# Patient Record
Sex: Female | Born: 1995
Health system: Southern US, Community
[De-identification: ages and names within clinical notes are randomized; demographics above are authoritative.]

---
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
## 2017-01-30 DIAGNOSIS — H5213 Myopia, bilateral: Secondary | ICD-10-CM | POA: Diagnosis not present

## 2017-01-30 DIAGNOSIS — H52223 Regular astigmatism, bilateral: Secondary | ICD-10-CM | POA: Diagnosis not present

## 2018-02-22 DIAGNOSIS — G43119 Migraine with aura, intractable, without status migrainosus: Secondary | ICD-10-CM | POA: Diagnosis not present

## 2018-02-22 DIAGNOSIS — G43019 Migraine without aura, intractable, without status migrainosus: Secondary | ICD-10-CM | POA: Diagnosis not present

## 2018-05-04 DIAGNOSIS — H52223 Regular astigmatism, bilateral: Secondary | ICD-10-CM | POA: Diagnosis not present

## 2018-05-04 DIAGNOSIS — H5213 Myopia, bilateral: Secondary | ICD-10-CM | POA: Diagnosis not present

## 2018-09-14 DIAGNOSIS — B009 Herpesviral infection, unspecified: Secondary | ICD-10-CM | POA: Diagnosis not present

## 2018-09-14 DIAGNOSIS — B354 Tinea corporis: Secondary | ICD-10-CM | POA: Diagnosis not present

## 2018-09-14 DIAGNOSIS — Z7689 Persons encountering health services in other specified circumstances: Secondary | ICD-10-CM | POA: Diagnosis not present

## 2018-09-14 DIAGNOSIS — Z6822 Body mass index (BMI) 22.0-22.9, adult: Secondary | ICD-10-CM | POA: Diagnosis not present

## 2019-01-22 DIAGNOSIS — R0789 Other chest pain: Secondary | ICD-10-CM | POA: Diagnosis not present

## 2019-01-22 DIAGNOSIS — F419 Anxiety disorder, unspecified: Secondary | ICD-10-CM | POA: Diagnosis not present

## 2019-01-22 DIAGNOSIS — Z6822 Body mass index (BMI) 22.0-22.9, adult: Secondary | ICD-10-CM | POA: Diagnosis not present

## 2019-01-22 DIAGNOSIS — F329 Major depressive disorder, single episode, unspecified: Secondary | ICD-10-CM | POA: Diagnosis not present

## 2019-01-31 DIAGNOSIS — Z0000 Encounter for general adult medical examination without abnormal findings: Secondary | ICD-10-CM | POA: Diagnosis not present

## 2019-01-31 DIAGNOSIS — Z6822 Body mass index (BMI) 22.0-22.9, adult: Secondary | ICD-10-CM | POA: Diagnosis not present

## 2019-01-31 DIAGNOSIS — F419 Anxiety disorder, unspecified: Secondary | ICD-10-CM | POA: Diagnosis not present

## 2019-01-31 DIAGNOSIS — F329 Major depressive disorder, single episode, unspecified: Secondary | ICD-10-CM | POA: Diagnosis not present

## 2020-01-29 ENCOUNTER — Emergency Department (HOSPITAL_COMMUNITY)
Admission: EM | Admit: 2020-01-29 | Discharge: 2020-01-29 | Disposition: A | Discharge status: Home / Self Care | Payer: Managed Care, Other (non HMO) | Attending: Emergency Medicine | Admitting: Emergency Medicine

## 2020-01-29 ENCOUNTER — Encounter (HOSPITAL_COMMUNITY): Payer: Self-pay

## 2020-01-29 ENCOUNTER — Emergency Department (HOSPITAL_COMMUNITY): Payer: Managed Care, Other (non HMO)

## 2020-01-29 DIAGNOSIS — R079 Chest pain, unspecified: Secondary | ICD-10-CM | POA: Insufficient documentation

## 2020-01-29 DIAGNOSIS — R519 Headache, unspecified: Secondary | ICD-10-CM | POA: Diagnosis not present

## 2020-01-29 DIAGNOSIS — R0789 Other chest pain: Secondary | ICD-10-CM | POA: Diagnosis present

## 2020-01-29 LAB — CBC
HCT: 33.5 % — ABNORMAL LOW (ref 36.0–46.0)
Hemoglobin: 10.9 g/dL — ABNORMAL LOW (ref 12.0–15.0)
MCH: 29.1 pg (ref 26.0–34.0)
MCHC: 32.5 g/dL (ref 30.0–36.0)
MCV: 89.3 fL (ref 80.0–100.0)
Platelets: 216 10*3/uL (ref 150–400)
RBC: 3.75 MIL/uL — ABNORMAL LOW (ref 3.87–5.11)
RDW: 13.9 % (ref 11.5–15.5)
WBC: 6.3 10*3/uL (ref 4.0–10.5)
nRBC: 0 % (ref 0.0–0.2)

## 2020-01-29 LAB — I-STAT BETA HCG BLOOD, ED (NOT ORDERABLE): I-stat hCG, quantitative: <5 m[IU]/mL (ref <5)

## 2020-01-29 LAB — BASIC METABOLIC PANEL
Anion gap: 11 (ref 5–15)
BUN: 10 mg/dL (ref 6–20)
CO2: 26 mmol/L (ref 22–32)
Calcium: 9.3 mg/dL (ref 8.9–10.3)
Chloride: 105 mmol/L (ref 98–111)
Creatinine, Ser: 0.66 mg/dL (ref 0.44–1.00)
GFR, Estimated: >60 mL/min (ref >60)
Glucose, Bld: 90 mg/dL (ref 70–99)
Potassium: 3.5 mmol/L (ref 3.5–5.1)
Sodium: 142 mmol/L (ref 135–145)

## 2020-01-29 LAB — TROPONIN I (HIGH SENSITIVITY): Troponin I (High Sensitivity): <2 ng/L (ref <18)

## 2020-01-29 NOTE — ED Triage Notes (Signed)
Pt presents with c/o chest pain. Pt reports chronic chest pain for 2 years. Pt reports she takes medication for migraines but when she took her medicine yesterday, she began to have a headache and her chest began to hurt again.

## 2020-01-29 NOTE — Discharge Instructions (Signed)
Continue your current medications.  Follow up with your neurologist as planned.  Your tests today did not show any signs of problems with your heart or lungs

## 2020-01-29 NOTE — ED Provider Notes (Signed)
[facility]-EMERGENCY DEPT Provider Note   CSN: [id number] Arrival date & time: 01/29/20  1200     History Chief Complaint  Patient presents with  . Chest Pain  . Headache    Whitney Franklin is a 24 y.o. female.  HPI   Pt has been haivng issues with her migraine.  She took a rizitriptan for her headache.  She has taken it before but after taking it yesterday she started having head pressure, a bad taste in her mouth and also chest pressure.  The symptoms resolved however she is still having chest pressure.  It is on both sides.  She feels like it takes more effort for her to breathe.   No fever.She called her doctors office who told her to come get checked in the ED.   History reviewed. No pertinent past medical history.  There are no problems to display for this patient.   History reviewed. No pertinent surgical history.   OB History   No obstetric history on file.     History reviewed. No pertinent family history.  Social History   Tobacco Use  . Smoking status: Never Smoker  . Smokeless tobacco: Never Used  Substance Use Topics  . Alcohol use: Never  . Drug use: Never    Home Medications Prior to Admission medications   Not on File    Allergies    Patient has no known allergies.  Review of Systems   Review of Systems  All other systems reviewed and are negative.   Physical Exam Updated Vital Signs BP 138/71   Pulse 67   Temp 98.3 F (36.8 C) (Oral)   Resp 17   LMP 01/22/2020 (Approximate)   SpO2 100%   Physical Exam Vitals and nursing note reviewed.  Constitutional:      General: She is not in acute distress.    Appearance: She is well-developed.  HENT:     Head: Normocephalic and atraumatic.     Right Ear: External ear normal.     Left Ear: External ear normal.  Eyes:     General: No scleral icterus.       Right eye: No discharge.        Left eye: No discharge.     Conjunctiva/sclera: Conjunctivae normal.    Neck:     Trachea: No tracheal deviation.  Cardiovascular:     Rate and Rhythm: Normal rate and regular rhythm.  Pulmonary:     Effort: Pulmonary effort is normal. No respiratory distress.     Breath sounds: Normal breath sounds. No stridor. No wheezing or rales.  Chest:     Chest wall: No mass or deformity. There is dullness to percussion.  Abdominal:     General: Bowel sounds are normal. There is no distension.     Palpations: Abdomen is soft.     Tenderness: There is no abdominal tenderness. There is no guarding or rebound.  Musculoskeletal:        General: No tenderness.     Cervical back: Neck supple.  Skin:    General: Skin is warm and dry.     Findings: No rash.  Neurological:     Mental Status: She is alert.     Cranial Nerves: No cranial nerve deficit (no facial droop, extraocular movements intact, no slurred speech).     Sensory: No sensory deficit.     Motor: No abnormal muscle tone or seizure activity.     Coordination: Coordination normal.  ED Results / Procedures / Treatments   Labs (all labs ordered are listed, but only abnormal results are displayed) Labs Reviewed  CBC - Abnormal; Notable for the following components:      Result Value   RBC 3.75 (*)    Hemoglobin 10.9 (*)    HCT 33.5 (*)    All other components within normal limits  BASIC METABOLIC PANEL  I-STAT BETA HCG BLOOD, ED ([facility], [facility], [facility] ONLY)  I-STAT BETA HCG BLOOD, ED (NOT ORDERABLE)  TROPONIN I (HIGH SENSITIVITY)    EKG EKG Interpretation  Date/Time:  Wednesday January 29 2020 12:07:04 EDT Ventricular Rate:  68 PR Interval:    QRS Duration: 87 QT Interval:  379 QTC Calculation: 403 R Axis:   74 Text Interpretation: Sinus rhythm No previous tracing Confirmed by Linwood Dibbles [id number]) on 01/29/2020 2:01:58 PM   Radiology DG Chest 2 View  Result Date: 01/29/2020 CLINICAL DATA:  Chest pain over the last 2 years. EXAM: CHEST - 2 VIEW COMPARISON:  None. FINDINGS: Heart size is normal.  Mediastinal shadows are normal. The lungs are clear. No bronchial thickening. No infiltrate, mass, effusion or collapse. Pulmonary vascularity is normal. No bony abnormality. IMPRESSION: Normal chest. Electronically Signed   By: Paulina Fusi M.D.   On: 01/29/2020 12:35    Procedures Procedures (including critical care time)  Medications Ordered in ED Medications - No data to display  ED Course  I have reviewed the triage vital signs and the nursing notes.  Pertinent labs & imaging results that were available during my care of the patient were reviewed by me and considered in my medical decision making (see chart for details).    MDM Rules/Calculators/A&P                           Pt presented to the ED for evaluation of chest pain.  Sx started after taking her migraine medications.  Low risk for acs.  Troponin is normal.  Doubt pe, perc negative.  Suspect related to her medication vs chest wall pain.  Pt also asked about brain imaging.  Neuro exam is normal.  Headache has resolved.  She is set to see a neurologist.  Stable to discuss need for brain imaging for chronic migraines with her neuro doctor. Final Clinical Impression(s) / ED Diagnoses Final diagnoses:  Chest pain, unspecified type    Rx / DC Orders ED Discharge Orders    None       Linwood Dibbles, MD 01/29/20 1432

## 2022-03-05 IMAGING — CR DG CHEST 2V
2 series · 2 of 2 positions shown · non-contrast
Comparison: None.

CLINICAL DATA: Chest pain over the last 2 years.

EXAM:
CHEST - 2 VIEW

[w chest pa]
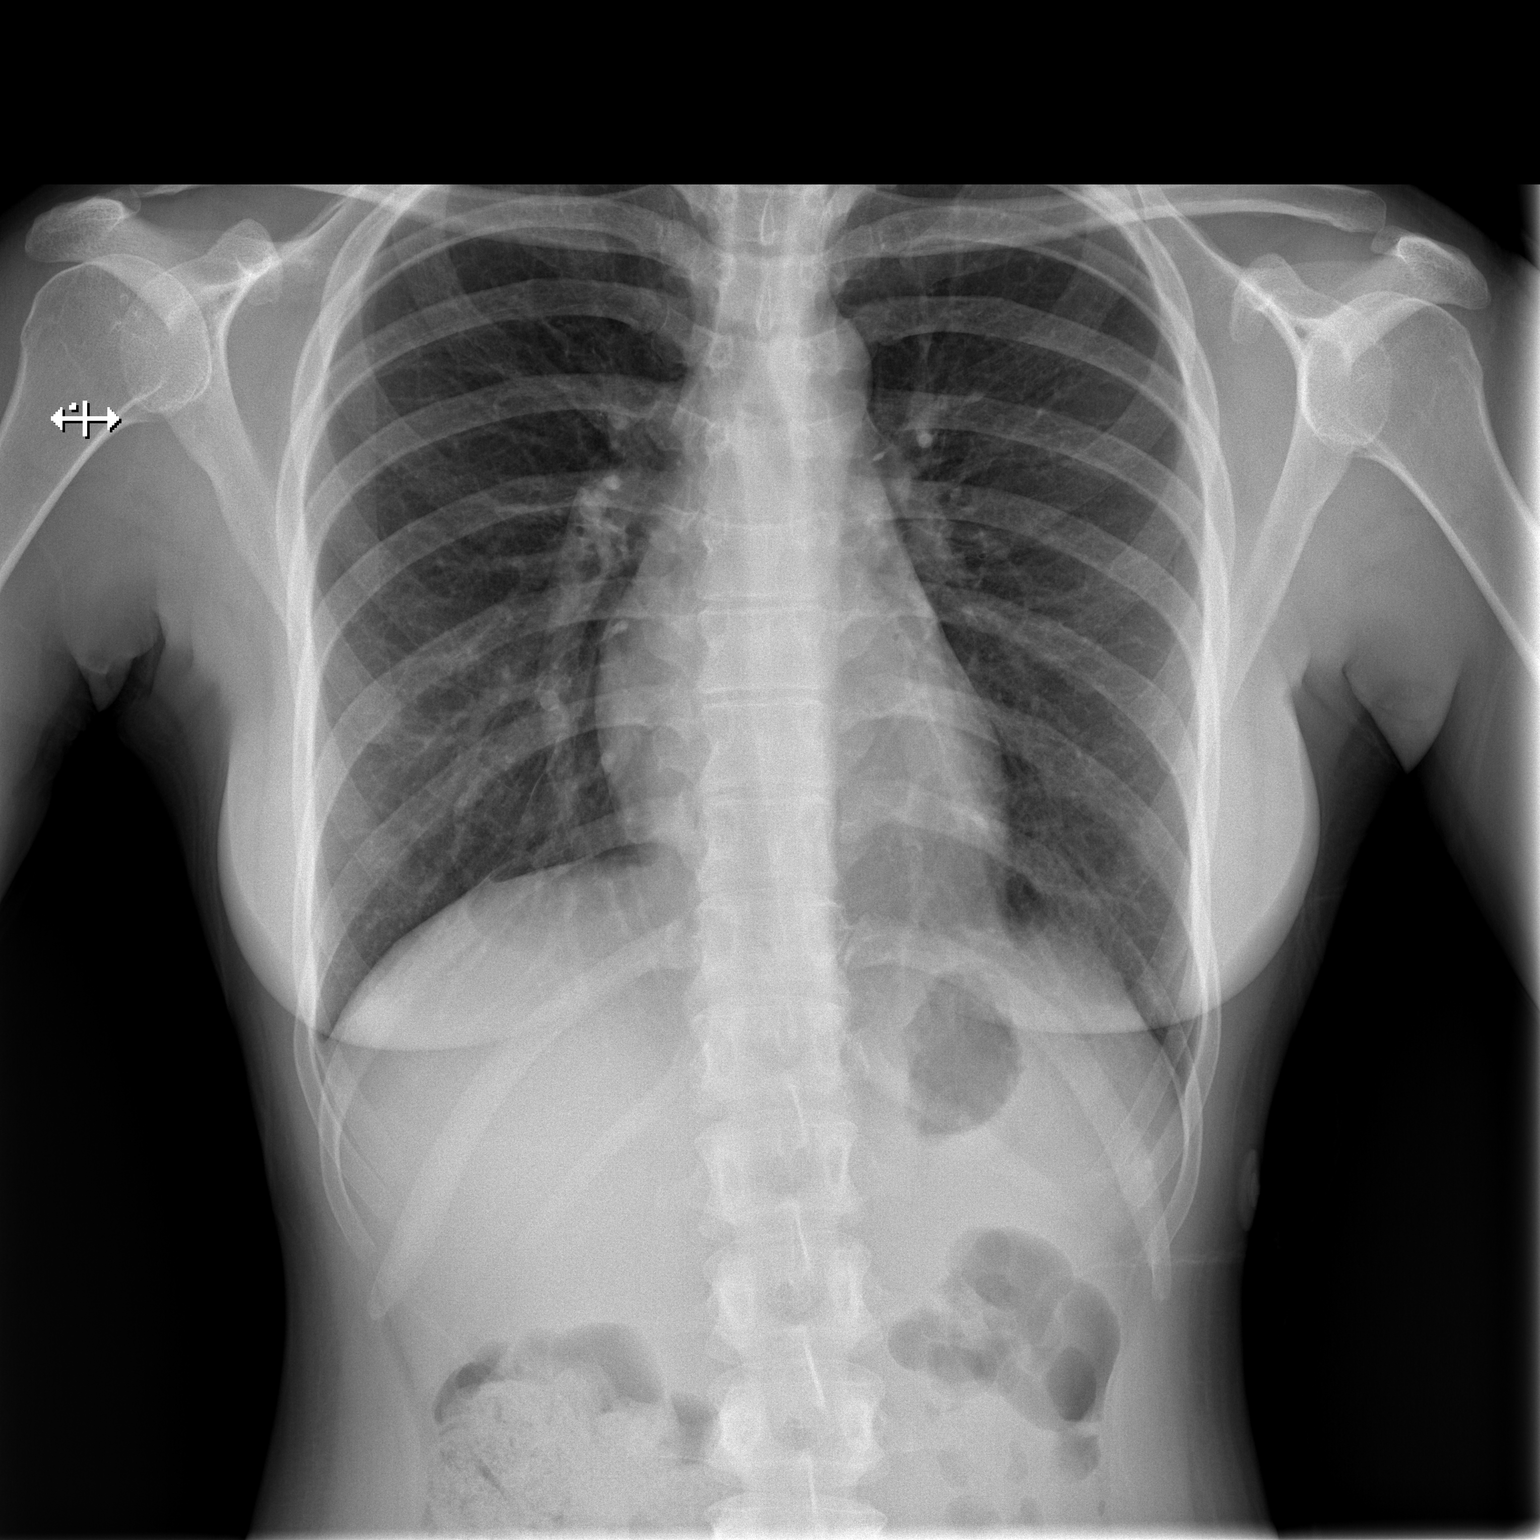

[w chest lat]
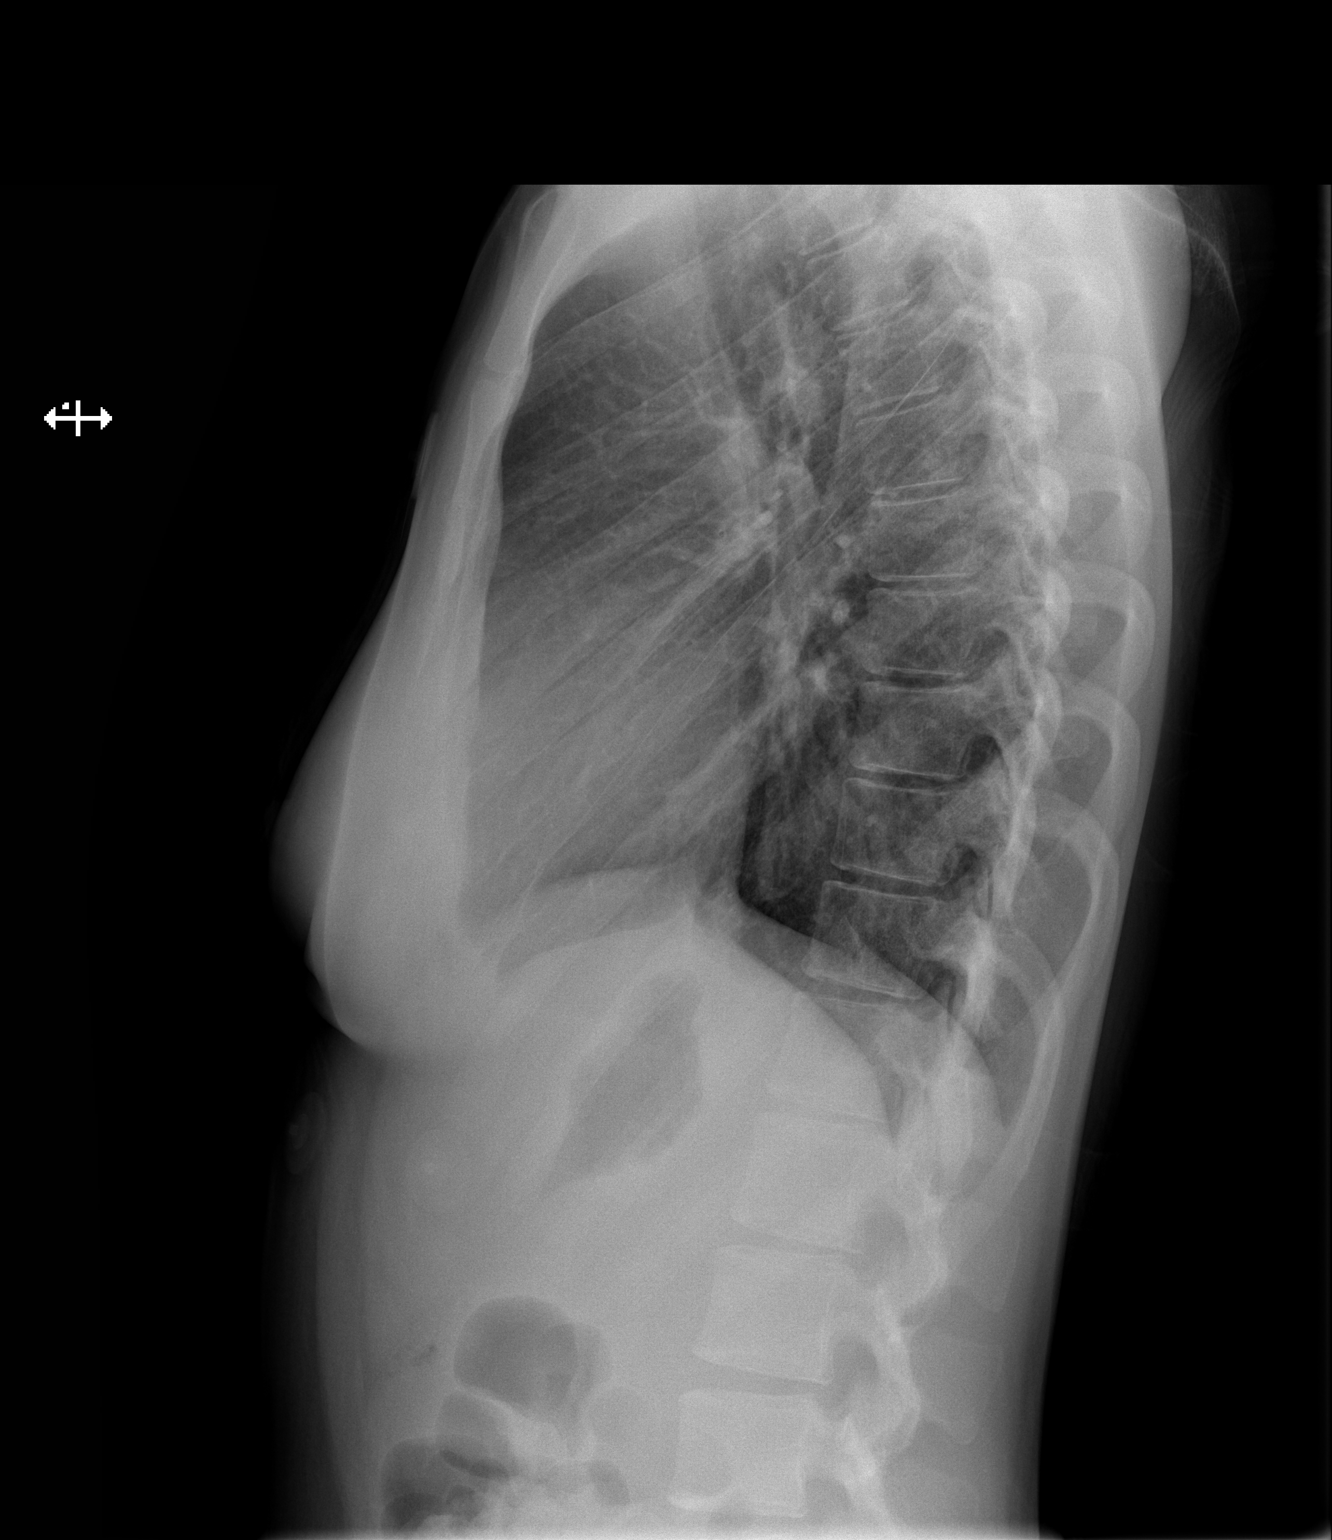

[2 of 2 positions shown; findings below may reference images not displayed]

FINDINGS: Heart size is normal. Mediastinal shadows are normal. The lungs are
clear. No bronchial thickening. No infiltrate, mass, effusion or
collapse. Pulmonary vascularity is normal. No bony abnormality.
IMPRESSION: Normal chest.
